# Patient Record
Sex: Male | Born: 1970 | Race: Asian | Hispanic: No | Marital: Married | Smoking: Former smoker
Health system: Southern US, Community
[De-identification: ages and names within clinical notes are randomized; demographics above are authoritative.]

---

## 2006-01-01 ENCOUNTER — Emergency Department (HOSPITAL_COMMUNITY): Admission: EM | Admit: 2006-01-01 | Discharge: 2006-01-01 | Payer: Self-pay | Admitting: Emergency Medicine

## 2008-07-27 ENCOUNTER — Emergency Department (HOSPITAL_COMMUNITY): Admission: EM | Admit: 2008-07-27 | Discharge: 2008-07-27 | Payer: Self-pay | Admitting: Emergency Medicine

## 2008-10-27 ENCOUNTER — Emergency Department (HOSPITAL_COMMUNITY): Admission: EM | Admit: 2008-10-27 | Discharge: 2008-10-28 | Payer: Self-pay | Admitting: Emergency Medicine

## 2011-07-07 ENCOUNTER — Emergency Department (HOSPITAL_COMMUNITY)
Admission: EM | Admit: 2011-07-07 | Discharge: 2011-07-07 | Disposition: A | Discharge status: Home / Self Care | Payer: BC Managed Care – PPO | Attending: Emergency Medicine | Admitting: Emergency Medicine

## 2011-07-07 DIAGNOSIS — L97109 Non-pressure chronic ulcer of unspecified thigh with unspecified severity: Secondary | ICD-10-CM | POA: Insufficient documentation

## 2011-07-07 DIAGNOSIS — L299 Pruritus, unspecified: Secondary | ICD-10-CM | POA: Insufficient documentation

## 2011-07-07 DIAGNOSIS — T6391XA Toxic effect of contact with unspecified venomous animal, accidental (unintentional), initial encounter: Secondary | ICD-10-CM | POA: Insufficient documentation

## 2011-07-07 DIAGNOSIS — M7989 Other specified soft tissue disorders: Secondary | ICD-10-CM | POA: Insufficient documentation

## 2011-07-07 DIAGNOSIS — T63391A Toxic effect of venom of other spider, accidental (unintentional), initial encounter: Secondary | ICD-10-CM | POA: Insufficient documentation

## 2011-07-09 ENCOUNTER — Ambulatory Visit (INDEPENDENT_AMBULATORY_CARE_PROVIDER_SITE_OTHER): Payer: BC Managed Care – PPO | Admitting: General Surgery

## 2011-07-09 ENCOUNTER — Encounter (INDEPENDENT_AMBULATORY_CARE_PROVIDER_SITE_OTHER): Payer: Self-pay | Admitting: General Surgery

## 2011-07-09 VITALS — BP 102/70 | HR 75 | Temp 97.2°F | Wt 225.6 lb

## 2011-07-09 DIAGNOSIS — L0291 Cutaneous abscess, unspecified: Secondary | ICD-10-CM

## 2011-07-09 NOTE — Progress Notes (Signed)
Isaiah Perry is a 40 y.o. male.    Chief Complaint  Patient presents with  . Follow-up    left thigh wound- on set 2wks ago    HPI HPI This patient is referred for evaluation of a "boil" on his left thigh. He first noticed the lesion 3 weeks ago and he states that over that time it has slowly increased in size and in tenderness. He was seen in the emergency room 2 days ago and was prescribed doxycycline but did not fill the medication due to financial reasons. He denies any fevers or chills but states that he has had some minimal drainage from the area. He does have some warmth and redness in the area as well. He was diagnosed in the emergency room as a "spider bite" but he does not recall any insect bites to the region.  No past medical history on file.  No past surgical history on file.  Family History  Problem Relation Age of Onset  . Diabetes      Social History History  Substance Use Topics  . Smoking status: Not on file  . Smokeless tobacco: Not on file  . Alcohol Use: Not on file    Allergies no known allergies  No current outpatient prescriptions on file.    Review of Systems Review of Systems  Constitutional: Negative.   HENT: Negative.   Eyes: Negative.   Respiratory: Negative.   Cardiovascular: Negative.   Gastrointestinal: Negative.   Genitourinary: Negative.   Musculoskeletal: Negative.   Skin: Negative.   Neurological: Negative.   Endo/Heme/Allergies: Negative.   Psychiatric/Behavioral: Negative.     Physical Exam Physical Exam  Constitutional: He is oriented to person, place, and time. He appears well-developed and well-nourished. No distress.  HENT:  Head: Normocephalic and atraumatic.  Eyes: EOM are normal. Pupils are equal, round, and reactive to light. Right eye exhibits no discharge. Left eye exhibits no discharge. No scleral icterus.  Neck: No tracheal deviation present.  Cardiovascular: Normal rate, regular rhythm and normal heart  sounds.   Respiratory: Effort normal. No stridor. No respiratory distress.  GI: Soft. He exhibits no distension. There is no tenderness.  Musculoskeletal: Normal range of motion.  Neurological: He is alert and oriented to person, place, and time.  Skin: Skin is warm and dry. He is not diaphoretic. There is erythema.       6 cm x 6 cm area of erythema on his left lateral thigh with a 4 cm x 4 cm area of central necrosis and purulence. This area is fluctuant and tender.  After informed consent was obtained, I prepped the area with chlorhexidine and the area when it was anesthetized with 20 cc of 1% lidocaine with epinephrine. The central area of necrosis was debrided sharply and the wound was probed for any cavities of undrained purulence. The wound was shallow and I left a 4 cm circular opening for continued drainage. The wound is packed and dressed. There were no apparent complications.  Psychiatric: He has a normal mood and affect. His behavior is normal. Judgment and thought content normal.     Blood pressure 102/70, pulse 75, temperature 97.2 F (36.2 C), weight 225 lb 9.6 oz (102.331 kg).  Assessment/Plan Left thigh skin abscess.  I performed incision and drainage and debridement of the necrotic skin today in the clinic under local anesthesia. This was well tolerated and the wound was much improved after the procedure. I think that with continued wound care and wound  packing this will feel better shortly. The wound is shallow and should not take too long to heal. I recommended that he fulfill a prescription of doxycycline and he was given by the emergency room. He will followup in one week for wound check and reevaluation. Recommended that he follow up sooner as needed if any increasing pain or redness or fevers.  Lodema Pilot DAVID 07/09/2011, 6:10 PM

## 2011-07-09 NOTE — Patient Instructions (Signed)
Continue with packing the wound twice daily until healed.  Return to clinic if increasing pain, fevers, or redness.

## 2011-07-18 ENCOUNTER — Encounter (INDEPENDENT_AMBULATORY_CARE_PROVIDER_SITE_OTHER): Payer: BC Managed Care – PPO | Admitting: General Surgery

## 2011-08-10 ENCOUNTER — Encounter (INDEPENDENT_AMBULATORY_CARE_PROVIDER_SITE_OTHER): Payer: Self-pay | Admitting: General Surgery

## 2015-11-12 ENCOUNTER — Emergency Department (HOSPITAL_COMMUNITY)
Admission: EM | Admit: 2015-11-12 | Discharge: 2015-11-12 | Disposition: A | Discharge status: Home / Self Care | Payer: BLUE CROSS/BLUE SHIELD | Attending: Emergency Medicine | Admitting: Emergency Medicine

## 2015-11-12 ENCOUNTER — Encounter (HOSPITAL_COMMUNITY): Payer: Self-pay | Admitting: Emergency Medicine

## 2015-11-12 ENCOUNTER — Emergency Department (HOSPITAL_COMMUNITY): Payer: BLUE CROSS/BLUE SHIELD

## 2015-11-12 DIAGNOSIS — Y92410 Unspecified street and highway as the place of occurrence of the external cause: Secondary | ICD-10-CM | POA: Diagnosis not present

## 2015-11-12 DIAGNOSIS — Y998 Other external cause status: Secondary | ICD-10-CM | POA: Insufficient documentation

## 2015-11-12 DIAGNOSIS — S3991XA Unspecified injury of abdomen, initial encounter: Secondary | ICD-10-CM | POA: Diagnosis not present

## 2015-11-12 DIAGNOSIS — Z87891 Personal history of nicotine dependence: Secondary | ICD-10-CM | POA: Insufficient documentation

## 2015-11-12 DIAGNOSIS — S62309A Unspecified fracture of unspecified metacarpal bone, initial encounter for closed fracture: Secondary | ICD-10-CM

## 2015-11-12 DIAGNOSIS — Y9389 Activity, other specified: Secondary | ICD-10-CM | POA: Diagnosis not present

## 2015-11-12 DIAGNOSIS — S60511A Abrasion of right hand, initial encounter: Secondary | ICD-10-CM | POA: Diagnosis not present

## 2015-11-12 DIAGNOSIS — S6992XA Unspecified injury of left wrist, hand and finger(s), initial encounter: Secondary | ICD-10-CM | POA: Diagnosis present

## 2015-11-12 DIAGNOSIS — S62395A Other fracture of fourth metacarpal bone, left hand, initial encounter for closed fracture: Secondary | ICD-10-CM | POA: Diagnosis not present

## 2015-11-12 LAB — I-STAT CHEM 8, ED
BUN: 18 mg/dL (ref 6–20)
CALCIUM ION: 1.21 mmol/L (ref 1.12–1.23)
CHLORIDE: 103 mmol/L (ref 101–111)
Creatinine, Ser: 1.1 mg/dL (ref 0.61–1.24)
GLUCOSE: 94 mg/dL (ref 65–99)
HCT: 45 % (ref 39.0–52.0)
Hemoglobin: 15.3 g/dL (ref 13.0–17.0)
Potassium: 4.3 mmol/L (ref 3.5–5.1)
Sodium: 140 mmol/L (ref 135–145)
TCO2: 26 mmol/L (ref 0–100)

## 2015-11-12 MED ORDER — IBUPROFEN 800 MG PO TABS: 800.0000 mg | ORAL_TABLET | Freq: Three times a day (TID) | ORAL | Status: DC

## 2015-11-12 MED ORDER — IOHEXOL 300 MG/ML  SOLN
100.0000 mL | Freq: Once | INTRAMUSCULAR | Status: AC | PRN
  Administered 2015-11-12: 100 mL via INTRAVENOUS

## 2015-11-12 MED ORDER — ACETAMINOPHEN 325 MG PO TABS
650.0000 mg | ORAL_TABLET | Freq: Once | ORAL | Status: AC
  Administered 2015-11-12: 650 mg via ORAL
  Filled 2015-11-12: qty 2

## 2015-11-12 MED ORDER — IBUPROFEN 800 MG PO TABS
800.0000 mg | ORAL_TABLET | Freq: Once | ORAL | Status: AC
  Administered 2015-11-12: 800 mg via ORAL
  Filled 2015-11-12: qty 1

## 2015-11-12 MED ORDER — HYDROCODONE-ACETAMINOPHEN 5-325 MG PO TABS: 1.0000 | ORAL_TABLET | Freq: Four times a day (QID) | ORAL | Status: DC | PRN

## 2015-11-12 NOTE — Progress Notes (Signed)
Orthopedic Tech Progress Note Patient Details:  Calton GoldsOltrick Procter 04/03/1971 [id number]  Ortho Devices Type of Ortho Device: Ace wrap, Ulna gutter splint Ortho Device/Splint Interventions: Application   Saul FordyceJennifer C Lorieann Argueta 11/12/2015, 9:10 AM

## 2015-11-12 NOTE — Discharge Instructions (Signed)
Metacarpal Fracture °A metacarpal fracture is a break (fracture) of a bone in the hand. Metacarpals are the bones that extend from your knuckles to your wrist. In each hand, you have five metacarpal bones that connect your fingers and your thumb to your wrist. °Some hand fractures have bone pieces that are close together and stable (simple). These fractures may be treated with only a splint or cast. Hand fractures that have many pieces of broken bone (comminuted), unstable bone pieces (displaced), or a bone that breaks through the skin (compound) usually require surgery. °CAUSES °This injury may be caused by: °· A fall. °· A hard, direct hit to your hand. °· An injury that squeezes your knuckle, stretches your finger out of place, or crushes your hand. °RISK FACTORS °This injury is more likely to occur if: °· You play contact sports. °· You have certain bone diseases. °SYMPTOMS  °Symptoms of this type of fracture develop soon after the injury. Symptoms may include: °· Swelling. °· Pain. °· Stiffness. °· Increased pain with movement. °· Bruising. °· Inability to move a finger. °· A shortened finger. °· A finger knuckle that looks sunken in. °· Unusual appearance of the hand or finger (deformity). °DIAGNOSIS  °This injury may be diagnosed based on your signs and symptoms, especially if you had a recent hand injury. Your health care provider will perform a physical exam. He or she may also order X-rays to confirm the diagnosis.  °TREATMENT  °Treatment for this injury depends on the type of fracture you have and how severe it is. Possible treatments include: °· Non-reduction. This can be done if the bone does not need to be moved back into place. The fracture can be casted or splinted as it is.   °· Closed reduction. If your bone is stable and can be moved back into place, you may only need to wear a cast or splint or have buddy taping. °· Closed reduction with internal fixation (CRIF). This is the most common  treatment. You may have this procedure if your bone can be moved back into place but needs more support. Wires, pins, or screws may be inserted through your skin to stabilize the fracture. °· Open reduction with internal fixation (ORIF). This may be needed if your fracture is severe and unstable. It involves surgery to move your bone back into the right position. Screws, wires, or plates are used to stabilize the fracture. °After all procedures, you may need to wear a cast or a splint for several weeks. You will also need to have follow-up X-rays to make sure that the bone is healing well and staying in position. After you no longer need your cast or splint, you may need physical therapy. This will help you to regain full movement and strength in your hand.  °HOME CARE INSTRUCTIONS  °If You Have a Cast: °· Do not stick anything inside the cast to scratch your skin. Doing that increases your risk of infection. °· Check the skin around the cast every day. Report any concerns to your health care provider. You may put lotion on dry skin around the edges of the cast. Do not apply lotion to the skin underneath the cast. °If You Have a Splint: °· Wear it as directed by your health care provider. Remove it only as directed by your health care provider. °· Loosen the splint if your fingers become numb and tingle, or if they turn cold and blue. °Bathing °· Cover the cast or splint with a   watertight plastic bag to protect it from water while you take a bath or a shower. Do not let the cast or splint get wet. Managing Pain, Stiffness, and Swelling  If directed, apply ice to the injured area (if you have a splint, not a cast):  Put ice in a plastic bag.  Place a towel between your skin and the bag.  Leave the ice on for 20 minutes, 2-3 times a day.  Move your fingers often to avoid stiffness and to lessen swelling.  Raise the injured area above the level of your heart while you are sitting or lying  down. Driving  Do not drive or operate heavy machinery while taking pain medicine.  Do not drive while wearing a cast or splint on a hand that you use for driving. Activity  Return to your normal activities as directed by your health care provider. Ask your health care provider what activities are safe for you. General Instructions  Do not put pressure on any part of the cast or splint until it is fully hardened. This may take several hours.  Keep the cast or splint clean and dry.  Do not use any tobacco products, including cigarettes, chewing tobacco, or electronic cigarettes. Tobacco can delay bone healing. If you need help quitting, ask your health care provider.  Take medicines only as directed by your health care provider.  Keep all follow-up visits as directed by your health care provider. This is important. SEEK MEDICAL CARE IF:   Your pain is getting worse.  You have redness, swelling, or pain in the injured area.   You have fluid, blood, or pus coming from under your cast or splint.   You notice a bad smell coming from under your cast or splint.   You have a fever.  SEEK IMMEDIATE MEDICAL CARE IF:   You develop a rash.   You have trouble breathing.   Your skin or nails on your injured hand turn blue or gray even after you loosen your splint.  Your injured hand feels cold or becomes numb even after you loosen your splint.   You develop severe pain under the cast or in your hand.   This information is not intended to replace advice given to you by your health care provider. Make sure you discuss any questions you have with your health care provider.   Document Released: 11/12/2005 Document Revised: 08/03/2015 Document Reviewed: 09/01/2014 Elsevier Interactive Patient Education 2016 [employer]  Tourist information centre managerMotor Vehicle Collision After a car crash (motor vehicle collision), it is normal to have bruises and sore muscles. The first 24 hours usually feel the worst.  After that, you will likely start to feel better each day. HOME CARE  Put ice on the injured area.  Put ice in a plastic bag.  Place a towel between your skin and the bag.  Leave the ice on for 15-20 minutes, 03-04 times a day.  Drink enough fluids to keep your pee (urine) clear or pale yellow.  Do not drink alcohol.  Take a warm shower or bath 1 or 2 times a day. This helps your sore muscles.  Return to activities as told by your doctor. Be careful when lifting. Lifting can make neck or back pain worse.  Only take medicine as told by your doctor. Do not use aspirin. GET HELP RIGHT AWAY IF:   Your arms or legs tingle, feel weak, or lose feeling (numbness).  You have headaches that do not get better with  medicine.  You have neck pain, especially in the middle of the back of your neck.  You cannot control when you pee (urinate) or poop (bowel movement).  Pain is getting worse in any part of your body.  You are short of breath, dizzy, or pass out (faint).  You have chest pain.  You feel sick to your stomach (nauseous), throw up (vomit), or sweat.  You have belly (abdominal) pain that gets worse.  There is blood in your pee, poop, or throw up.  You have pain in your shoulder (shoulder strap areas).  Your problems are getting worse. MAKE SURE YOU:   Understand these instructions.  Will watch your condition.  Will get help right away if you are not doing well or get worse.   This information is not intended to replace advice given to you by your health care provider. Make sure you discuss any questions you have with your health care provider.   Document Released: 04/30/2008 Document Revised: 02/04/2012 Document Reviewed: 04/11/2011 Elsevier Interactive Patient Education [employer]
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
## 2019-01-12 ENCOUNTER — Encounter (HOSPITAL_COMMUNITY): Payer: Self-pay | Admitting: Emergency Medicine

## 2019-01-12 ENCOUNTER — Ambulatory Visit (HOSPITAL_COMMUNITY)
Admission: EM | Admit: 2019-01-12 | Discharge: 2019-01-12 | Disposition: A | Discharge status: Home / Self Care | Payer: Managed Care, Other (non HMO) | Attending: Internal Medicine | Admitting: Internal Medicine

## 2019-01-12 DIAGNOSIS — H6693 Otitis media, unspecified, bilateral: Secondary | ICD-10-CM | POA: Diagnosis not present

## 2019-01-12 DIAGNOSIS — H1033 Unspecified acute conjunctivitis, bilateral: Secondary | ICD-10-CM

## 2019-01-12 MED ORDER — AMOXICILLIN-POT CLAVULANATE 875-125 MG PO TABS: 1.0000 | ORAL_TABLET | Freq: Two times a day (BID) | ORAL | 0 refills | Status: DC

## 2019-01-12 MED ORDER — SULFACETAMIDE SODIUM 10 % OP SOLN: 1.0000 [drp] | OPHTHALMIC | 0 refills | Status: AC

## 2019-01-12 NOTE — ED Triage Notes (Signed)
Pt here for bilateral eye redness and irritation 

## 2019-01-12 NOTE — Discharge Instructions (Addendum)
History and exam today suggest that eye irritation is related to a sinus/ear infection.  Prescriptions for amoxicillin/clavulanate (antibiotic) and some eye drops (sulfacetamide) were sent to the pharmacy.  NOte for work tomorrow.  Anticipate gradual improvement in eye redness/irritation, head congestion, cough, over the next several days.  Cough may take a couple weeks to subside.  Recheck for new fever >100.5, increasing phlegm production/nasal discharge, or if not starting to improve in a few days.

## 2019-01-12 NOTE — ED Provider Notes (Signed)
[facility]    CSN: [id number] Arrival date & time: 01/12/19  1740     History   Chief Complaint Chief Complaint  Patient presents with  . Conjunctivitis    HPI Isaiah Perry is a 48 y.o. male.   He presents today with couple days history of irritated, red eyes, little bit itchy.  They were stuck shut this morning, and he has had some very light discharge from them during the day today.  No runny/congested nose that he is aware of, but does have some cough.  No fever.  Change in vision.  Wife had similar symptoms, which are now improving.    HPI  History reviewed. No pertinent past medical history.   History reviewed. No pertinent surgical history.     Home Medications    Prior to Admission medications   Medication Sig Start Date End Date Taking? Authorizing Provider  amoxicillin-clavulanate (AUGMENTIN) 875-125 MG tablet Take 1 tablet by mouth every 12 (twelve) hours. 01/12/19   Isa Rankin, MD  HYDROcodone-acetaminophen (NORCO/VICODIN) 5-325 MG tablet Take 1 tablet by mouth every 6 (six) hours as needed. 11/12/15   Cheri Fowler, PA-C  ibuprofen (ADVIL,MOTRIN) 800 MG tablet Take 1 tablet (800 mg total) by mouth 3 (three) times daily. 11/12/15   Cheri Fowler, PA-C  sulfacetamide (BLEPH-10) 10 % ophthalmic solution Place 1-2 drops into both eyes every 2 (two) hours while awake for 5 days. 01/12/19 01/17/19  Isa Rankin, MD    Family History Family History  Problem Relation Age of Onset  . Diabetes Unknown     Social History Social History   Tobacco Use  . Smoking status: Former Smoker  Substance Use Topics  . Alcohol use: No  . Drug use: No     Allergies   Patient has no known allergies.   Review of Systems Review of Systems  All other systems reviewed and are negative.    Physical Exam Triage Vital Signs ED Triage Vitals  Enc Vitals Group     BP 01/12/19 1901 130/67     Pulse Rate 01/12/19 1901 (!) 58     Resp 01/12/19  1901 16     Temp 01/12/19 1901 98.2 F (36.8 C)     Temp Source 01/12/19 1901 Oral     SpO2 01/12/19 1901 100 %     Weight --      Height --      Pain Score 01/12/19 1920 7     Pain Loc --    Updated Vital Signs BP 130/67 (BP Location: Left Arm)   Pulse (!) 58   Temp 98.2 F (36.8 C) (Oral)   Resp 16   SpO2 100%   Right Eye Near: R Near: 20/40 without corrective lens Left Eye Near:  L Near: 20/50 without corrective lens Bilateral Near:  20/40 without corrective lens  Physical Exam Vitals signs and nursing note reviewed.  Constitutional:      General: He is not in acute distress.    Comments: Alert, nicely groomed  HENT:     Head: Atraumatic.     Comments: Bilateral TMs are quite dull, erythematous Moderate nasal congestion bilaterally with mucopurulent material present Posterior pharynx is quite red with postnasal drainage evident Eyes:     Comments: Bilateral conjunctivae are diffusely injected, with scant mucopurulent discharge noted, conjugate gaze observed  Neck:     Musculoskeletal: Neck supple.  Cardiovascular:     Rate and Rhythm: Normal rate and regular rhythm.  Pulmonary:     Effort: No respiratory distress.     Breath sounds: No wheezing or rales.     Comments: Lungs clear, symmetric breath sounds  Abdominal:     General: There is no distension.  Musculoskeletal: Normal range of motion.  Skin:    General: Skin is warm and dry.     Comments: No cyanosis  Neurological:     Mental Status: He is alert and oriented to person, place, and time.       Final Clinical Impressions(s) / UC Diagnoses   Final diagnoses:  Acute bilateral otitis media  Acute conjunctivitis of both eyes, unspecified acute conjunctivitis type     Discharge Instructions     History and exam today suggest that eye irritation is related to a sinus/ear infection.  Prescriptions for amoxicillin/clavulanate (antibiotic) and some eye drops (sulfacetamide) were sent to the pharmacy.   NOte for work tomorrow.  Anticipate gradual improvement in eye redness/irritation, head congestion, cough, over the next several days.  Cough may take a couple weeks to subside.  Recheck for new fever >100.5, increasing phlegm production/nasal discharge, or if not starting to improve in a few days.      ED Prescriptions    Medication Sig Dispense Auth. Provider   amoxicillin-clavulanate (AUGMENTIN) 875-125 MG tablet Take 1 tablet by mouth every 12 (twelve) hours. 14 tablet Isa Rankin, MD   sulfacetamide (BLEPH-10) 10 % ophthalmic solution Place 1-2 drops into both eyes every 2 (two) hours while awake for 5 days. 4.5 mL Isa Rankin, MD        Isa Rankin, MD 01/18/19 2039

## 2019-07-06 ENCOUNTER — Encounter (HOSPITAL_COMMUNITY): Payer: Self-pay

## 2019-07-06 ENCOUNTER — Ambulatory Visit (HOSPITAL_COMMUNITY)
Admission: EM | Admit: 2019-07-06 | Discharge: 2019-07-06 | Disposition: A | Discharge status: Home / Self Care | Payer: Managed Care, Other (non HMO) | Attending: Family Medicine | Admitting: Family Medicine

## 2019-07-06 ENCOUNTER — Other Ambulatory Visit: Payer: Self-pay

## 2019-07-06 DIAGNOSIS — H1031 Unspecified acute conjunctivitis, right eye: Secondary | ICD-10-CM

## 2019-07-06 MED ORDER — POLYMYXIN B-TRIMETHOPRIM 10000-0.1 UNIT/ML-% OP SOLN: 1.0000 [drp] | OPHTHALMIC | 0 refills | Status: DC

## 2019-07-06 MED ORDER — CETIRIZINE HCL 10 MG PO TABS: 10.0000 mg | ORAL_TABLET | Freq: Every day | ORAL | 0 refills | Status: AC

## 2019-07-06 NOTE — Discharge Instructions (Signed)
Treating you for infection of the eye and possible allergy. Use the eyedrops in both eyes every 4 hours while awake. Zyrtec daily Follow up as needed for continued or worsening symptoms

## 2019-07-06 NOTE — ED Triage Notes (Signed)
Patient presents to Urgent Care with complaints of bilateral red watery, itchy, and occasionally painful eyes since the day before yesterday. Patient reports this has happened in the past and he does not know the cause other than allergies.

## 2019-07-06 NOTE — ED Provider Notes (Signed)
[facility]    CSN: [id number] Arrival date & time: 07/06/19  1007     History   Chief Complaint Chief Complaint  Patient presents with  . Conjunctivitis    HPI Isaiah Perry is a 48 y.o. male.   Patient is a 48 year old male with no significant past medical history.  He presents today with bilateral redness, watery, itchy eyes.  Symptoms have been constant and worsening.  Worse on the right.  History of allergies.  Does not take any allergy medicine.  The drainage is thick and purulent especially on the right.  Woke up this morning with eye stuck together.  Lids are very swollen.  Denies any blurred vision, pain, fever.  Denies any foreign bodies or injury to the eye.  ROS per HPI      History reviewed. No pertinent past medical history.  There are no active problems to display for this patient.   History reviewed. No pertinent surgical history.     Home Medications    Prior to Admission medications   Medication Sig Start Date End Date Taking? Authorizing Provider  cetirizine (ZYRTEC) 10 MG tablet Take 1 tablet (10 mg total) by mouth daily. 07/06/19   Dahlia ByesBast, Lorette Peterkin A, NP  trimethoprim-polymyxin b (POLYTRIM) ophthalmic solution Place 1 drop into both eyes every 4 (four) hours. 07/06/19   Janace ArisBast, Gearald Stonebraker A, NP    Family History Family History  Problem Relation Age of Onset  . Healthy Mother   . Diabetes Other     Social History Social History   Tobacco Use  . Smoking status: Former Games developermoker  . Smokeless tobacco: Never Used  Substance Use Topics  . Alcohol use: No  . Drug use: No     Allergies   Patient has no known allergies.   Review of Systems Review of Systems   Physical Exam Triage Vital Signs ED Triage Vitals  Enc Vitals Group     BP 07/06/19 1050 (!) 132/91     Pulse Rate 07/06/19 1050 72     Resp 07/06/19 1050 17     Temp 07/06/19 1050 98.4 F (36.9 C)     Temp Source 07/06/19 1050 Oral     SpO2 07/06/19 1050 99 %     Weight --       Height --      Head Circumference --      Peak Flow --      Pain Score 07/06/19 1049 0     Pain Loc --      Pain Edu? --      Excl. in GC? --    No data found.  Updated Vital Signs BP (!) 132/91 (BP Location: Right Arm)   Pulse 72   Temp 98.4 F (36.9 C) (Oral)   Resp 17   SpO2 99%   Visual Acuity Right Eye Distance:   Left Eye Distance:   Bilateral Distance:    Right Eye Near:   Left Eye Near:    Bilateral Near:     Physical Exam Vitals signs and nursing note reviewed.  Constitutional:      General: He is not in acute distress.    Appearance: Normal appearance. He is not ill-appearing, toxic-appearing or diaphoretic.  Eyes:     General:        Right eye: Discharge present.        Left eye: Discharge present.    Extraocular Movements: Extraocular movements intact.     Conjunctiva/sclera:  Right eye: Right conjunctiva is injected. Exudate present.     Left eye: Left conjunctiva is injected. Exudate present.     Pupils: Pupils are equal, round, and reactive to light.     Comments: Bilateral eyelid swelling, scleral injection, drainage Worse to right eye.  Neck:     Musculoskeletal: Normal range of motion.  Pulmonary:     Effort: Pulmonary effort is normal.  Musculoskeletal: Normal range of motion.  Skin:    General: Skin is warm and dry.  Neurological:     Mental Status: He is alert.  Psychiatric:        Mood and Affect: Mood normal.      UC Treatments / Results  Labs (all labs ordered are listed, but only abnormal results are displayed) Labs Reviewed - No data to display  EKG   Radiology No results found.  Procedures Procedures (including critical care time)  Medications Ordered in UC Medications - No data to display  Initial Impression / Assessment and Plan / UC Course  I have reviewed the triage vital signs and the nursing notes.  Pertinent labs & imaging results that were available during my care of the patient were reviewed by  me and considered in my medical decision making (see chart for details).     Symptoms consistent with bacterial conjunctivitis. Treating with Polytrim eyedrops Also recommended taking Zyrtec daily for allergies Follow up as needed for continued or worsening symptoms  Final Clinical Impressions(s) / UC Diagnoses   Final diagnoses:  Acute bacterial conjunctivitis of right eye     Discharge Instructions     Treating you for infection of the eye and possible allergy. Use the eyedrops in both eyes every 4 hours while awake. Zyrtec daily Follow up as needed for continued or worsening symptoms     ED Prescriptions    Medication Sig Dispense Auth. Provider   trimethoprim-polymyxin b (POLYTRIM) ophthalmic solution Place 1 drop into both eyes every 4 (four) hours. 10 mL Clydine Parkison A, NP   cetirizine (ZYRTEC) 10 MG tablet Take 1 tablet (10 mg total) by mouth daily. 30 tablet Loura Halt A, NP     Controlled Substance Prescriptions [place] Controlled Substance Registry consulted? Not Applicable   Orvan July, NP 07/06/19 1606

## 2019-08-25 ENCOUNTER — Ambulatory Visit (HOSPITAL_COMMUNITY)
Admission: EM | Admit: 2019-08-25 | Discharge: 2019-08-25 | Disposition: A | Discharge status: Home / Self Care | Payer: Managed Care, Other (non HMO) | Attending: Family Medicine | Admitting: Family Medicine

## 2019-08-25 ENCOUNTER — Encounter (HOSPITAL_COMMUNITY): Payer: Self-pay

## 2019-08-25 ENCOUNTER — Other Ambulatory Visit: Payer: Self-pay

## 2019-08-25 DIAGNOSIS — H1032 Unspecified acute conjunctivitis, left eye: Secondary | ICD-10-CM | POA: Diagnosis not present

## 2019-08-25 MED ORDER — TETRACAINE HCL 0.5 % OP SOLN
OPHTHALMIC | Status: AC
  Filled 2019-08-25: qty 4

## 2019-08-25 MED ORDER — FLUORESCEIN SODIUM 1 MG OP STRP
ORAL_STRIP | OPHTHALMIC | Status: AC
  Filled 2019-08-25: qty 1

## 2019-08-25 MED ORDER — POLYMYXIN B-TRIMETHOPRIM 10000-0.1 UNIT/ML-% OP SOLN: 1.0000 [drp] | OPHTHALMIC | 0 refills | Status: AC

## 2019-08-25 MED ORDER — OLOPATADINE HCL 0.1 % OP SOLN: 1.0000 [drp] | Freq: Two times a day (BID) | OPHTHALMIC | 0 refills | Status: AC | PRN

## 2019-08-25 NOTE — ED Triage Notes (Signed)
Pt states he has been draining and itches x 1 week or more. ( left eye )

## 2019-08-25 NOTE — Discharge Instructions (Signed)
Begin Polytrim eyedrops in left eye to treat any bacterial infection contributing to your eye redness You may also try olopatadine eyedrops twice daily to help with any allergic component, watering, burning, may use in both eyes  Please follow-up with ophthalmology if symptoms persisting, continuing to be recurrent Please follow-up if developing eye pain, swelling, change in vision

## 2019-08-25 NOTE — ED Provider Notes (Signed)
[facility]    CSN: [id number] Arrival date & time: 08/25/19  1415      History   Chief Complaint Chief Complaint  Patient presents with  . Conjunctivitis    HPI Isaiah Perry is a 48 y.o. male no significant past medical history presenting today for evaluation of left eye redness and drainage.  Patient states that for approximately 1 week he has had red eye.  He has had watery drainage, notes to have this thicker puslike drainage in the morning.  He is also had occasional stinging.  Denies vision changes.  Denies contact use.  Denies any injury trauma or possible scratching of eye.  Denies eye pain.  Has had similar symptoms previously most recently on 8/10.  Improved with Polytrim.  Denies close contacts with similar symptoms.  Denies working outside.  HPI  History reviewed. No pertinent past medical history.  There are no active problems to display for this patient.   History reviewed. No pertinent surgical history.     Home Medications    Prior to Admission medications   Medication Sig Start Date End Date Taking? Authorizing Provider  cetirizine (ZYRTEC) 10 MG tablet Take 1 tablet (10 mg total) by mouth daily. 07/06/19   Bast, Gloris Manchesterraci A, NP  olopatadine (PATANOL) 0.1 % ophthalmic solution Place 1 drop into both eyes 2 (two) times daily as needed (watering, burning). 08/25/19   Malvika Tung C, PA-C  trimethoprim-polymyxin b (POLYTRIM) ophthalmic solution Place 1 drop into the left eye every 4 (four) hours for 7 days. 08/25/19 09/01/19  Gabryella Murfin, Junius CreamerHallie C, PA-C    Family History Family History  Problem Relation Age of Onset  . Healthy Mother   . Diabetes Other     Social History Social History   Tobacco Use  . Smoking status: Former Games developermoker  . Smokeless tobacco: Never Used  Substance Use Topics  . Alcohol use: No  . Drug use: No     Allergies   Patient has no known allergies.   Review of Systems Review of Systems  Constitutional: Negative for  activity change, appetite change, chills, fatigue and fever.  HENT: Negative for congestion, ear pain, rhinorrhea, sinus pressure, sore throat and trouble swallowing.   Eyes: Positive for discharge, redness and itching. Negative for photophobia, pain and visual disturbance.  Respiratory: Negative for cough, chest tightness and shortness of breath.   Cardiovascular: Negative for chest pain.  Gastrointestinal: Negative for abdominal pain, diarrhea, nausea and vomiting.  Musculoskeletal: Negative for myalgias.  Skin: Negative for rash.  Neurological: Negative for dizziness, light-headedness and headaches.     Physical Exam Triage Vital Signs ED Triage Vitals  Enc Vitals Group     BP 08/25/19 1424 114/73     Pulse Rate 08/25/19 1424 78     Resp 08/25/19 1424 18     Temp 08/25/19 1424 98.2 F (36.8 C)     Temp Source 08/25/19 1424 Oral     SpO2 08/25/19 1424 100 %     Weight 08/25/19 1422 250 lb (113.4 kg)     Height --      Head Circumference --      Peak Flow --      Pain Score 08/25/19 1422 0     Pain Loc --      Pain Edu? --      Excl. in GC? --    No data found.  Updated Vital Signs BP 114/73 (BP Location: Right Arm)   Pulse 78  Temp 98.2 F (36.8 C) (Oral)   Resp 18   Wt 250 lb (113.4 kg)   SpO2 100%   BMI 35.87 kg/m   Visual Acuity Right Eye Distance:   Left Eye Distance:   Bilateral Distance:    Right Eye Near:  20/30 Left Eye Near:   20/50 Bilateral Near:   20/30  Physical Exam Vitals signs and nursing note reviewed.  Constitutional:      Appearance: He is well-developed.  HENT:     Head: Normocephalic and atraumatic.     Ears:     Comments: Bilateral ears without tenderness to palpation of external auricle, tragus and mastoid, EAC's without erythema or swelling, TM's with good bony landmarks and cone of light. Non erythematous.     Mouth/Throat:     Comments: Oral mucosa pink and moist, no tonsillar enlargement or exudate. Posterior pharynx patent  and nonerythematous, no uvula deviation or swelling. Normal phonation.  Eyes:     General:        Left eye: Discharge present.    Extraocular Movements: Extraocular movements intact.     Pupils: Pupils are equal, round, and reactive to light.     Comments: Left eye with conjunctival erythema, limbus clear, no photophobia with exam, anterior chamber clear  No fluorescein uptake, no sign of abrasion or ulcer  Small amount of clearish yellowish drainage noted in medial canthus  No periorbital swelling or erythema  Neck:     Musculoskeletal: Neck supple.  Cardiovascular:     Rate and Rhythm: Normal rate and regular rhythm.     Heart sounds: No murmur.  Pulmonary:     Effort: Pulmonary effort is normal. No respiratory distress.     Breath sounds: Normal breath sounds.  Abdominal:     Palpations: Abdomen is soft.     Tenderness: There is no abdominal tenderness.  Skin:    General: Skin is warm and dry.  Neurological:     Mental Status: He is alert.      UC Treatments / Results  Labs (all labs ordered are listed, but only abnormal results are displayed) Labs Reviewed - No data to display  EKG   Radiology No results found.  Procedures Procedures (including critical care time)  Medications Ordered in UC Medications  tetracaine (PONTOCAINE) 0.5 % ophthalmic solution (has no administration in time range)  fluorescein 1 MG ophthalmic strip (has no administration in time range)    Initial Impression / Assessment and Plan / UC Course  I have reviewed the triage vital signs and the nursing notes.  Pertinent labs & imaging results that were available during my care of the patient were reviewed by me and considered in my medical decision making (see chart for details).     Patient with unilateral eye redness.  Without pain.  We will go ahead and cover for bacterial conjunctivitis with Polytrim, but also providing all of [place] to help with any allergic component  contributing to symptoms.  Has slight decrease in visual acuity compared to right, but no other red flags.  Will have patient continue to monitor,Discussed strict return precautions. Patient verbalized understanding and is agreeable with plan.  Final Clinical Impressions(s) / UC Diagnoses   Final diagnoses:  Acute conjunctivitis of left eye, unspecified acute conjunctivitis type     Discharge Instructions     Begin Polytrim eyedrops in left eye to treat any bacterial infection contributing to your eye redness You may also try olopatadine eyedrops twice daily  to help with any allergic component, watering, burning, may use in both eyes  Please follow-up with ophthalmology if symptoms persisting, continuing to be recurrent Please follow-up if developing eye pain, swelling, change in vision   ED Prescriptions    Medication Sig Dispense Auth. Provider   olopatadine (PATANOL) 0.1 % ophthalmic solution Place 1 drop into both eyes 2 (two) times daily as needed (watering, burning). 5 mL Karsyn Jamie C, PA-C   trimethoprim-polymyxin b (POLYTRIM) ophthalmic solution Place 1 drop into the left eye every 4 (four) hours for 7 days. 10 mL Maryanna Stuber, [place] C, PA-C     PDMP not reviewed this encounter.   Lew Dawes, [place] 08/25/19 1554

## 2021-06-01 ENCOUNTER — Other Ambulatory Visit: Payer: Self-pay

## 2021-06-01 ENCOUNTER — Encounter (HOSPITAL_COMMUNITY): Payer: Self-pay

## 2021-06-01 ENCOUNTER — Ambulatory Visit (HOSPITAL_COMMUNITY)
Admission: EM | Admit: 2021-06-01 | Discharge: 2021-06-01 | Disposition: A | Discharge status: Home / Self Care | Payer: Managed Care, Other (non HMO) | Attending: Student | Admitting: Student

## 2021-06-01 DIAGNOSIS — H109 Unspecified conjunctivitis: Secondary | ICD-10-CM | POA: Diagnosis not present

## 2021-06-01 DIAGNOSIS — B9689 Other specified bacterial agents as the cause of diseases classified elsewhere: Secondary | ICD-10-CM | POA: Diagnosis not present

## 2021-06-01 MED ORDER — POLYMYXIN B-TRIMETHOPRIM 10000-0.1 UNIT/ML-% OP SOLN: 1.0000 [drp] | OPHTHALMIC | 0 refills | Status: DC

## 2021-06-01 NOTE — ED Triage Notes (Signed)
Pt presents with eye redness and itching x 1 week.

## 2021-06-01 NOTE — Discharge Instructions (Addendum)
-  Start the trimethoprim-polymyxin b (POLYTRIM) ophthalmic solution. Place 1 drop into both eyes every 4 (four) hours while awake for 7 days. -Warm compresses or wash face with gentle soap and water -You'll be contagious still for 24 hours after starting the antibiotic -Follow-up if new symptoms like vision changes, vision loss, eye pain with movement

## 2021-06-01 NOTE — ED Provider Notes (Signed)
[facility]    CSN: [id number] Arrival date & time: 06/01/21  1713      History   Chief Complaint Chief Complaint  Patient presents with   eye redness    HPI Isaiah Perry is a 50 y.o. male presenting with R eye redness and itching x1 week, now with new onset of L eye symptoms. Medical history noncontributory, feeling well otherwise. Wears contacts but not glasses.  Endorses right eye redness, itching, photophobia, yellow crusting in the morning for 1 week, getting progressively worse.  With new onset of similar symptoms in the left eye for the last 2 days.  Denies injury to eye, foreign body sensation,  eye pain, eye pain with movement, injury to eye, vision changes, double vision, flashes of light or floaters of light in field of vision. Denies recent URI.   HPI  History reviewed. No pertinent past medical history.  There are no problems to display for this patient.   History reviewed. No pertinent surgical history.     Home Medications    Prior to Admission medications   Medication Sig Start Date End Date Taking? Authorizing Provider  trimethoprim-polymyxin b (POLYTRIM) ophthalmic solution Place 1 drop into both eyes every 4 (four) hours. Every 4 hours while awake for 7 days. 06/01/21  Yes Rhys Martini, PA-C  cetirizine (ZYRTEC) 10 MG tablet Take 1 tablet (10 mg total) by mouth daily. 07/06/19   Bast, Gloris [place] A, NP  olopatadine (PATANOL) 0.1 % ophthalmic solution Place 1 drop into both eyes 2 (two) times daily as needed (watering, burning). 08/25/19   Wieters, Junius Creamer, PA-C    Family History Family History  Problem Relation Age of Onset   Healthy Mother    Diabetes Other     Social History Social History   Tobacco Use   Smoking status: Former    Pack years: 0.00   Smokeless tobacco: Never  Vaping Use   Vaping Use: Never used  Substance Use Topics   Alcohol use: No   Drug use: No     Allergies   Patient has no known allergies.   Review of  Systems Review of Systems  Eyes:  Positive for photophobia, discharge, redness and itching. Negative for pain and visual disturbance.  All other systems reviewed and are negative.   Physical Exam Triage Vital Signs ED Triage Vitals  Enc Vitals Group     BP 06/01/21 1837 112/74     Pulse Rate 06/01/21 1837 70     Resp 06/01/21 1837 18     Temp 06/01/21 1837 98 F (36.7 C)     Temp Source 06/01/21 1837 Oral     SpO2 06/01/21 1837 98 %     Weight --      Height --      Head Circumference --      Peak Flow --      Pain Score 06/01/21 1852 0     Pain Loc --      Pain Edu? --      Excl. in GC? --    No data found.  Updated Vital Signs BP 112/74   Pulse 70   Temp 98 F (36.7 C) (Oral)   Resp 18   SpO2 98%   Visual Acuity Right Eye Distance: 20/40 Left Eye Distance: 20/30 Bilateral Distance: 20/40  Right Eye Near:   Left Eye Near:    Bilateral Near:     Physical Exam Vitals reviewed.  Constitutional:  General: He is not in acute distress.    Appearance: Normal appearance. He is not ill-appearing or diaphoretic.  HENT:     Head: Normocephalic and atraumatic.  Eyes:     General: Lids are normal. Lids are everted, no foreign bodies appreciated. Vision grossly intact. Gaze aligned appropriately. No visual field deficit.       Right eye: Discharge present. No foreign body or hordeolum.        Left eye: No foreign body, discharge or hordeolum.     Extraocular Movements: Extraocular movements intact.     Conjunctiva/sclera:     Right eye: Right conjunctiva is injected. No chemosis, exudate or hemorrhage.    Left eye: Left conjunctiva is injected. No chemosis, exudate or hemorrhage.    Comments: EOMI, PERRLA, R eye with diffuse conjunctival injection. Scant injection L conjunctivae. No hemorrhage. No eyelid effusion or tenderness. No orbital tenderness.  Cardiovascular:     Rate and Rhythm: Normal rate and regular rhythm.     Heart sounds: Normal heart sounds.   Pulmonary:     Effort: Pulmonary effort is normal.     Breath sounds: Normal breath sounds.  Skin:    General: Skin is warm.  Neurological:     General: No focal deficit present.     Mental Status: He is alert and oriented to person, place, and time.  Psychiatric:        Mood and Affect: Mood normal.        Behavior: Behavior normal.        Thought Content: Thought content normal.        Judgment: Judgment normal.     UC Treatments / Results  Labs (all labs ordered are listed, but only abnormal results are displayed) Labs Reviewed - No data to display  EKG   Radiology No results found.  Procedures Procedures (including critical care time)  Medications Ordered in UC Medications - No data to display  Initial Impression / Assessment and Plan / UC Course  I have reviewed the triage vital signs and the nursing notes.  Pertinent labs & imaging results that were available during my care of the patient were reviewed by me and considered in my medical decision making (see chart for details).     This patient is a very pleasant 50 y.o. year old male presenting with bacterial conjunctivitis. Visual acuity intact. Wears glasses but not contacts.   Polytrim sent as below.   ED return precautions discussed. Patient verbalizes understanding and agreement.   Final Clinical Impressions(s) / UC Diagnoses   Final diagnoses:  Bacterial conjunctivitis of both eyes     Discharge Instructions      -Start the trimethoprim-polymyxin b (POLYTRIM) ophthalmic solution. Place 1 drop into both eyes every 4 (four) hours while awake for 7 days. -Warm compresses or wash face with gentle soap and water -You'll be contagious still for 24 hours after starting the antibiotic -Follow-up if new symptoms like vision changes, vision loss, eye pain with movement     ED Prescriptions     Medication Sig Dispense Auth. Provider   trimethoprim-polymyxin b (POLYTRIM) ophthalmic solution Place 1  drop into both eyes every 4 (four) hours. Every 4 hours while awake for 7 days. 10 mL Rhys Martini, PA-C      PDMP not reviewed this encounter.   Rhys Martini, PA-C 06/01/21 2001

## 2023-07-31 ENCOUNTER — Ambulatory Visit (HOSPITAL_COMMUNITY)
Admission: EM | Admit: 2023-07-31 | Discharge: 2023-07-31 | Disposition: A | Discharge status: Home / Self Care | Payer: Managed Care, Other (non HMO) | Attending: Emergency Medicine | Admitting: Emergency Medicine

## 2023-07-31 ENCOUNTER — Encounter (HOSPITAL_COMMUNITY): Payer: Self-pay

## 2023-07-31 DIAGNOSIS — H1033 Unspecified acute conjunctivitis, bilateral: Secondary | ICD-10-CM | POA: Diagnosis not present

## 2023-07-31 MED ORDER — POLYMYXIN B-TRIMETHOPRIM 10000-0.1 UNIT/ML-% OP SOLN: 1.0000 [drp] | OPHTHALMIC | 0 refills | Status: AC

## 2023-07-31 NOTE — ED Triage Notes (Signed)
Patient here today with c/o bilat eye itching, redness, and drainage X 1 week. He has been using clear eyes with some relief.

## 2023-07-31 NOTE — ED Provider Notes (Signed)
[facility]    CSN: [id number] Arrival date & time: 07/31/23  1517      History   Chief Complaint Chief Complaint  Patient presents with   Eye Problem    HPI Burdette Stapleford is a 52 y.o. male.   Patient presents to clinic for bilateral red eyes.  About a week ago his right eye started with redness, itching and crusting yellow-green drainage.  It is since spread to his left eye.  He has been using over-the-counter Clear Eyes with minimal temporary relief.  He denies any vision changes or pain.  He does not wear contacts.  He wears glasses.  Denies any eye trauma, nothing flew into his eye.     The history is provided by the patient and medical records.  Eye Problem Associated symptoms: discharge, itching and redness   Associated symptoms: no photophobia     History reviewed. No pertinent past medical history.  There are no problems to display for this patient.   History reviewed. No pertinent surgical history.     Home Medications    Prior to Admission medications   Medication Sig Start Date End Date Taking? Authorizing Provider  cetirizine (ZYRTEC) 10 MG tablet Take 1 tablet (10 mg total) by mouth daily. 07/06/19   Bast, Gloris Manchester A, NP  olopatadine (PATANOL) 0.1 % ophthalmic solution Place 1 drop into both eyes 2 (two) times daily as needed (watering, burning). 08/25/19   Wieters, Hallie C, PA-C  trimethoprim-polymyxin b (POLYTRIM) ophthalmic solution Place 1 drop into both eyes every 4 (four) hours. Every 4 hours while awake for 7 days. 07/31/23   Quang Thorpe, [place] N, FNP    Family History Family History  Problem Relation Age of Onset   Healthy Mother    Diabetes Other     Social History Social History   Tobacco Use   Smoking status: Former   Smokeless tobacco: Never  Vaping Use   Vaping status: Never Used  Substance Use Topics   Alcohol use: No   Drug use: No     Allergies   Patient has no known allergies.   Review of Systems Review of  Systems  Eyes:  Positive for discharge, redness and itching. Negative for photophobia, pain and visual disturbance.     Physical Exam Triage Vital Signs ED Triage Vitals  Encounter Vitals Group     BP 07/31/23 1555 108/63     Systolic BP Percentile --      Diastolic BP Percentile --      Pulse Rate 07/31/23 1555 (!) 56     Resp 07/31/23 1555 16     Temp 07/31/23 1555 98.1 F (36.7 C)     Temp Source 07/31/23 1555 Oral     SpO2 07/31/23 1555 97 %     Weight 07/31/23 1554 232 lb (105.2 kg)     Height 07/31/23 1554 5' 10.5" (1.791 m)     Head Circumference --      Peak Flow --      Pain Score 07/31/23 1553 0     Pain Loc --      Pain Education --      Exclude from Growth Chart --    No data found.  Updated Vital Signs BP 108/63 (BP Location: Right Arm)   Pulse (!) 56   Temp 98.1 F (36.7 C) (Oral)   Resp 16   Ht 5' 10.5" (1.791 m)   Wt 232 lb (105.2 kg)   SpO2 97%  BMI 32.82 kg/m   Visual Acuity Right Eye Distance:   Left Eye Distance:   Bilateral Distance:    Right Eye Near:   Left Eye Near:    Bilateral Near:     Physical Exam Vitals and nursing note reviewed.  Constitutional:      Appearance: Normal appearance.  HENT:     Head: Normocephalic and atraumatic.     Right Ear: External ear normal.     Left Ear: External ear normal.     Nose: Nose normal.     Mouth/Throat:     Mouth: Mucous membranes are moist.  Eyes:     General: Lids are normal. Lids are everted, no foreign bodies appreciated. Vision grossly intact. Gaze aligned appropriately.        Right eye: Discharge present.        Left eye: Discharge present.    Conjunctiva/sclera:     Right eye: Right conjunctiva is injected. Exudate present.     Left eye: Left conjunctiva is injected. Exudate present.  Musculoskeletal:        General: Normal range of motion.  Neurological:     General: No focal deficit present.     Mental Status: He is alert.  Psychiatric:        Mood and Affect: Mood  normal.        Behavior: Behavior is cooperative.      UC Treatments / Results  Labs (all labs ordered are listed, but only abnormal results are displayed) Labs Reviewed - No data to display  EKG   Radiology No results found.  Procedures Procedures (including critical care time)  Medications Ordered in UC Medications - No data to display  Initial Impression / Assessment and Plan / UC Course  I have reviewed the triage vital signs and the nursing notes.  Pertinent labs & imaging results that were available during my care of the patient were reviewed by me and considered in my medical decision making (see chart for details).  Vitals and triage reviewed, patient is hemodynamically stable. PERRLA.  No pain or vision changes.  Bilateral conjunctival injection with scant purulent discharge.  Symptoms started in right eye and subsequently spread to left.  Consistent with bacterial conjunctivitis, does not wear contacts.  Will treat with Polytrim.  Plan of care, follow-up care and return precautions given, no questions at this time.     Final Clinical Impressions(s) / UC Diagnoses   Final diagnoses:  Acute bacterial conjunctivitis of both eyes     Discharge Instructions      Use the antibiotic eyedrops every 4 hours for the next 7 days.  Please wash your hands before and after application.  Use a clean warm rag to wipe away the discharge from your eyes.  You were no longer contagious after 24 hours on antibiotics.  Please follow-up with ophthalmology or seek immediate care if you develop any vision changes or eye pain.  Return to clinic for new or urgent symptoms.      ED Prescriptions     Medication Sig Dispense Auth. Provider   trimethoprim-polymyxin b (POLYTRIM) ophthalmic solution Place 1 drop into both eyes every 4 (four) hours. Every 4 hours while awake for 7 days. 10 mL Suede Greenawalt, [place] N, FNP      PDMP not reviewed this encounter.   Annamay Laymon, [place] N,  [place] 07/31/23 1622

## 2023-07-31 NOTE — Discharge Instructions (Signed)
Use the antibiotic eyedrops every 4 hours for the next 7 days.  Please wash your hands before and after application.  Use a clean warm rag to wipe away the discharge from your eyes.  You were no longer contagious after 24 hours on antibiotics.  Please follow-up with ophthalmology or seek immediate care if you develop any vision changes or eye pain.  Return to clinic for new or urgent symptoms.
